# Patient Record
Sex: Female | Born: 1995 | Race: White | Hispanic: No | Marital: Single | Smoking: Never smoker
Health system: Southern US, Community
[De-identification: ages and names within clinical notes are randomized; demographics above are authoritative.]

## PROBLEM LIST (undated history)

## (undated) DIAGNOSIS — K37 Unspecified appendicitis: Secondary | ICD-10-CM

## (undated) DIAGNOSIS — D649 Anemia, unspecified: Secondary | ICD-10-CM

## (undated) HISTORY — PX: APPENDECTOMY: SHX54

---

## 2017-06-30 NOTE — L&D Delivery Note (Addendum)
Patient: Julia Wallace MRN: [id number]  GBS status: Negative  Patient is a 22 y.o. now [id number] s/p NSVD at [redacted]w[redacted]d, who was admitted for . AROM 0h 84m prior to delivery with light meconium fluid.    Delivery Note At 8:34 AM a viable female was delivered via Vaginal, Spontaneous (Presentation: Left OA).  APGAR: 9, 9; weight pending.   Placenta status: Spontaneous, intact.  Cord: intact, 3 vessel with the following complications: nuchal cord x1.   Anesthesia:   Episiotomy: None Lacerations: 2nd degree;Perineal Suture Repair: 3.0 vicryl Est. Blood Loss (mL): 366  Head delivered left OA. Nuchal cord x1 present. Shoulder and body delivered in usual fashion. Infant with spontaneous cry, placed on mother's abdomen, dried and bulb suctioned. Cord clamped x 2 after 1-minute delay, and cut by family member. Cord blood drawn. Placenta delivered spontaneously with gentle cord traction. Fundus firm with massage and Pitocin. Perineum inspected and found to have a 2nd degree laceration, which was repaired with 3.0 vicryl with good hemostasis achieved. Also found to have a two superficial labial abrasions, which were hemostatic.   Mom to postpartum.  Baby to Couplet care / Skin to Skin.   Allayne Stack 04/10/2018, 9:22 AM  Attestation: I have seen this patient and agree with the resident's documentation. I was present for the entire delivery and assisted with the repair.   Cristal Deer. Earlene Plater, DO OB/GYN Fellow

## 2017-11-12 LAB — OB RESULTS CONSOLE HEPATITIS B SURFACE ANTIGEN: HEP B S AG: NEGATIVE

## 2017-11-13 LAB — OB RESULTS CONSOLE HIV ANTIBODY (ROUTINE TESTING): HIV: NONREACTIVE

## 2017-11-13 LAB — OB RESULTS CONSOLE ABO/RH: RH Type: NEGATIVE

## 2017-11-13 LAB — OB RESULTS CONSOLE RUBELLA ANTIBODY, IGM: Rubella: IMMUNE

## 2017-11-13 LAB — OB RESULTS CONSOLE ANTIBODY SCREEN: Antibody Screen: NEGATIVE

## 2018-02-08 LAB — OB RESULTS CONSOLE RPR: RPR: NONREACTIVE

## 2018-02-08 LAB — OB RESULTS CONSOLE GC/CHLAMYDIA
Chlamydia: NEGATIVE
GC PROBE AMP, GENITAL: NEGATIVE

## 2018-03-15 LAB — OB RESULTS CONSOLE GBS: STREP GROUP B AG: NEGATIVE

## 2018-04-10 ENCOUNTER — Inpatient Hospital Stay (HOSPITAL_COMMUNITY)
Admission: AD | Admit: 2018-04-10 | Discharge: 2018-04-11 | DRG: 807 | Disposition: A | Discharge status: Home / Self Care | Payer: Medicaid Other | Attending: Family Medicine | Admitting: Family Medicine

## 2018-04-10 ENCOUNTER — Other Ambulatory Visit: Payer: Self-pay

## 2018-04-10 ENCOUNTER — Encounter (HOSPITAL_COMMUNITY): Payer: Self-pay | Admitting: *Deleted

## 2018-04-10 DIAGNOSIS — O479 False labor, unspecified: Secondary | ICD-10-CM

## 2018-04-10 DIAGNOSIS — O9902 Anemia complicating childbirth: Secondary | ICD-10-CM | POA: Diagnosis present

## 2018-04-10 DIAGNOSIS — D649 Anemia, unspecified: Secondary | ICD-10-CM | POA: Diagnosis present

## 2018-04-10 DIAGNOSIS — O99214 Obesity complicating childbirth: Secondary | ICD-10-CM | POA: Diagnosis present

## 2018-04-10 DIAGNOSIS — F17200 Nicotine dependence, unspecified, uncomplicated: Secondary | ICD-10-CM | POA: Diagnosis present

## 2018-04-10 DIAGNOSIS — Z3A39 39 weeks gestation of pregnancy: Secondary | ICD-10-CM | POA: Diagnosis not present

## 2018-04-10 DIAGNOSIS — Z3483 Encounter for supervision of other normal pregnancy, third trimester: Secondary | ICD-10-CM | POA: Diagnosis present

## 2018-04-10 DIAGNOSIS — O99334 Smoking (tobacco) complicating childbirth: Secondary | ICD-10-CM | POA: Diagnosis present

## 2018-04-10 DIAGNOSIS — E669 Obesity, unspecified: Secondary | ICD-10-CM | POA: Diagnosis present

## 2018-04-10 HISTORY — DX: Anemia, unspecified: D64.9

## 2018-04-10 LAB — HIV ANTIBODY (ROUTINE TESTING W REFLEX): HIV Screen 4th Generation wRfx: NONREACTIVE

## 2018-04-10 LAB — CBC
HEMATOCRIT: 33.6 % — AB (ref 36.0–46.0)
HEMOGLOBIN: 10.8 g/dL — AB (ref 12.0–15.0)
MCH: 26 pg (ref 26.0–34.0)
MCHC: 32.1 g/dL (ref 30.0–36.0)
MCV: 80.8 fL (ref 80.0–100.0)
Platelets: 182 10*3/uL (ref 150–400)
RBC: 4.16 MIL/uL (ref 3.87–5.11)
RDW: 15.4 % (ref 11.5–15.5)
WBC: 15.7 10*3/uL — ABNORMAL HIGH (ref 4.0–10.5)
nRBC: 0 % (ref 0.0–0.2)

## 2018-04-10 MED ORDER — OXYCODONE-ACETAMINOPHEN 5-325 MG PO TABS: 1.0000 | ORAL_TABLET | ORAL | Status: DC | PRN

## 2018-04-10 MED ORDER — ONDANSETRON HCL 4 MG PO TABS: 4.0000 mg | ORAL_TABLET | ORAL | Status: DC | PRN

## 2018-04-10 MED ORDER — SODIUM CHLORIDE 0.9 % IV SOLN: 250.0000 mL | INTRAVENOUS | Status: DC | PRN

## 2018-04-10 MED ORDER — LIDOCAINE HCL (PF) 1 % IJ SOLN
30.0000 mL | INTRAMUSCULAR | Status: DC | PRN
  Administered 2018-04-10: 30 mL via SUBCUTANEOUS
  Filled 2018-04-10: qty 30

## 2018-04-10 MED ORDER — COCONUT OIL OIL: 1.0000 "application " | TOPICAL_OIL | Status: DC | PRN

## 2018-04-10 MED ORDER — SIMETHICONE 80 MG PO CHEW: 80.0000 mg | CHEWABLE_TABLET | ORAL | Status: DC | PRN

## 2018-04-10 MED ORDER — PRENATAL MULTIVITAMIN CH
1.0000 | ORAL_TABLET | Freq: Every day | ORAL | Status: DC
  Administered 2018-04-10 – 2018-04-11 (×2): 1 via ORAL
  Filled 2018-04-10 (×2): qty 1

## 2018-04-10 MED ORDER — MEASLES, MUMPS & RUBELLA VAC ~~LOC~~ INJ
0.5000 mL | INJECTION | Freq: Once | SUBCUTANEOUS | Status: DC
  Filled 2018-04-10: qty 0.5

## 2018-04-10 MED ORDER — SODIUM CHLORIDE 0.9% FLUSH: 3.0000 mL | Freq: Two times a day (BID) | INTRAVENOUS | Status: DC

## 2018-04-10 MED ORDER — IBUPROFEN 600 MG PO TABS
600.0000 mg | ORAL_TABLET | Freq: Four times a day (QID) | ORAL | Status: DC
  Administered 2018-04-10 – 2018-04-11 (×5): 600 mg via ORAL
  Filled 2018-04-10 (×5): qty 1

## 2018-04-10 MED ORDER — LACTATED RINGERS IV SOLN: 500.0000 mL | INTRAVENOUS | Status: DC | PRN

## 2018-04-10 MED ORDER — OXYTOCIN BOLUS FROM INFUSION
500.0000 mL | Freq: Once | INTRAVENOUS | Status: AC
  Administered 2018-04-10: 500 mL via INTRAVENOUS

## 2018-04-10 MED ORDER — ONDANSETRON HCL 4 MG/2ML IJ SOLN: 4.0000 mg | INTRAMUSCULAR | Status: DC | PRN

## 2018-04-10 MED ORDER — LACTATED RINGERS IV SOLN
INTRAVENOUS | Status: DC
  Administered 2018-04-10: 02:00:00 via INTRAVENOUS

## 2018-04-10 MED ORDER — SOD CITRATE-CITRIC ACID 500-334 MG/5ML PO SOLN: 30.0000 mL | ORAL | Status: DC | PRN

## 2018-04-10 MED ORDER — DIBUCAINE 1 % RE OINT: 1.0000 "application " | TOPICAL_OINTMENT | RECTAL | Status: DC | PRN

## 2018-04-10 MED ORDER — TETANUS-DIPHTH-ACELL PERTUSSIS 5-2.5-18.5 LF-MCG/0.5 IM SUSP: 0.5000 mL | Freq: Once | INTRAMUSCULAR | Status: DC

## 2018-04-10 MED ORDER — SENNOSIDES-DOCUSATE SODIUM 8.6-50 MG PO TABS
2.0000 | ORAL_TABLET | ORAL | Status: DC
  Administered 2018-04-10: 2 via ORAL
  Filled 2018-04-10: qty 2

## 2018-04-10 MED ORDER — OXYCODONE-ACETAMINOPHEN 5-325 MG PO TABS: 2.0000 | ORAL_TABLET | ORAL | Status: DC | PRN

## 2018-04-10 MED ORDER — OXYTOCIN 40 UNITS IN LACTATED RINGERS INFUSION - SIMPLE MED
2.5000 [IU]/h | INTRAVENOUS | Status: DC
  Administered 2018-04-10: 2.5 [IU]/h via INTRAVENOUS
  Filled 2018-04-10: qty 1000

## 2018-04-10 MED ORDER — SODIUM CHLORIDE 0.9% FLUSH: 3.0000 mL | INTRAVENOUS | Status: DC | PRN

## 2018-04-10 MED ORDER — BENZOCAINE-MENTHOL 20-0.5 % EX AERO
1.0000 "application " | INHALATION_SPRAY | CUTANEOUS | Status: DC | PRN
  Administered 2018-04-10: 1 via TOPICAL
  Filled 2018-04-10: qty 56

## 2018-04-10 MED ORDER — WITCH HAZEL-GLYCERIN EX PADS: 1.0000 "application " | MEDICATED_PAD | CUTANEOUS | Status: DC | PRN

## 2018-04-10 MED ORDER — ZOLPIDEM TARTRATE 5 MG PO TABS: 5.0000 mg | ORAL_TABLET | Freq: Every evening | ORAL | Status: DC | PRN

## 2018-04-10 MED ORDER — ACETAMINOPHEN 325 MG PO TABS: 650.0000 mg | ORAL_TABLET | ORAL | Status: DC | PRN

## 2018-04-10 MED ORDER — ONDANSETRON HCL 4 MG/2ML IJ SOLN
4.0000 mg | Freq: Four times a day (QID) | INTRAMUSCULAR | Status: DC | PRN
  Administered 2018-04-10: 4 mg via INTRAVENOUS
  Filled 2018-04-10: qty 2

## 2018-04-10 MED ORDER — DIPHENHYDRAMINE HCL 25 MG PO CAPS: 25.0000 mg | ORAL_CAPSULE | Freq: Four times a day (QID) | ORAL | Status: DC | PRN

## 2018-04-10 NOTE — Progress Notes (Signed)
Patient ID: Julia Wallace, female   DOB: Jul 06, 1995, 22 y.o.   MRN: [id number] Doing well Vitals:   04/10/18 0304 04/10/18 0347 04/10/18 0450 04/10/18 0510  BP: (!) 110/94 118/66 118/60 127/62  Pulse: 88 83 76 99  Resp: 20  18   Temp:   97.7 F (36.5 C)   TempSrc:   Oral   Weight:      Height:       FHR stable  Dilation: 7 Effacement (%): 70 Cervical Position: Middle Station: -1 Presentation: Vertex Exam by:: Renaldo Harrison, RNC  Anticipate SVD

## 2018-04-10 NOTE — H&P (Addendum)
Julia Wallace is a 22 y.o. female G2P1001 with IUP at [redacted]w[redacted]d presenting for contractions which started at 6am and got closer as the day went on.. Pt states she has been having regular, every 5 minutes contractions, associated with none vaginal bleeding..  Membranes are intact, with active fetal movement.   [facility]   Prenatal History/Complications:  Past Medical History: Past Medical History:  Diagnosis Date  . Anemia     Past Surgical History: Past Surgical History:  Procedure Laterality Date  . APPENDECTOMY      Obstetrical History: OB History    Gravida  2   Para  1   Term  1   Preterm      AB      Living  1     SAB      TAB      Ectopic      Multiple      Live Births  1            Social History: Social History   Socioeconomic History  . Marital status: Single    Spouse name: Not on file  . Number of children: Not on file  . Years of education: Not on file  . Highest education level: Not on file  Occupational History  . Not on file  Social Needs  . Financial resource strain: Not very hard  . Food insecurity:    Worry: Patient refused    Inability: Patient refused  . Transportation needs:    Medical: Patient refused    Non-medical: Patient refused  Tobacco Use  . Smoking status: Never Smoker  . Smokeless tobacco: Never Used  Substance and Sexual Activity  . Alcohol use: Never    Frequency: Never  . Drug use: Never  . Sexual activity: Yes  Lifestyle  . Physical activity:    Days per week: 7 days    Minutes per session: 60 min  . Stress: Patient refused  Relationships  . Social connections:    Talks on phone: Patient refused    Gets together: Patient refused    Attends religious service: Patient refused    Active member of club or organization: Patient refused    Attends meetings of clubs or organizations: Patient refused    Relationship status: Patient refused  Other Topics Concern  . Not on file  Social  History Narrative  . Not on file    Family History: Family History  Problem Relation Age of Onset  . Heart disease Maternal Grandmother     Allergies: Not on File  No medications prior to admission.    Review of Systems   Constitutional: Negative for fever and chills Eyes: Negative for visual disturbances Respiratory: Negative for shortness of breath, dyspnea Cardiovascular: Negative for chest pain or palpitations  Gastrointestinal: Negative for vomiting, diarrhea and constipation.  POSITIVE for abdominal pain (contractions) Genitourinary: Negative for dysuria and urgency Musculoskeletal: Negative for back pain, joint pain, myalgias  Neurological: Negative for dizziness and headaches  Blood pressure 107/70, pulse 83, temperature 97.8 F (36.6 C), temperature source Oral, resp. rate 18, height 5\' 2"  (1.575 m), weight 88.5 kg. General appearance: alert, cooperative and no distress Lungs: clear to auscultation bilaterally Heart: regular rate and rhythm Abdomen: soft, non-tender; bowel sounds normal Extremities: Homans sign is negative, no sign of DVT DTR's 2+ Presentation: cephalic Fetal monitoring  Baseline: 140 bpm, Variability: Good {> 6 bpm), Accelerations: Reactive and Decelerations: Absent Uterine activity  Frequency: Every  3 minutes Dilation: 6 Effacement (%): 70 Station: -1, 0 Exam by:: K.Wilson,RN   Prenatal labs: ABO, Rh: --/--/O NEG (10/12 0136) Antibody: PENDING (10/12 0136) Rubella: Immune (05/17 0000) RPR: Nonreactive (08/12 0000)  HBsAg: Negative (05/16 0000)  HIV: Non-reactive (05/17 0000)  GBS: Negative (09/16 0000)  1 hr Glucola normal Genetic screening  normal Anatomy US normal  Prenatal Transfer Tool  Maternal Diabetes: No Genetic Screening: Normal Maternal Ultrasounds/Referrals: Normal Fetal Ultrasounds or other Referrals:  None Maternal Substance Abuse:  Yes:  Type: Smoker Significant Maternal Medications:  None Significant Maternal  Lab Results: Lab values include: Group B Strep negative     Results for orders placed or performed during the hospital encounter of 04/10/18 (from the past 24 hour(s))  CBC   Collection Time: 04/10/18  1:36 AM  Result Value Ref Range   WBC 15.7 (H) 4.0 - 10.5 K/uL   RBC 4.16 3.87 - 5.11 MIL/uL   Hemoglobin 10.8 (L) 12.0 - 15.0 g/dL   HCT 09.8 (L) 11.9 - 14.7 %   MCV 80.8 80.0 - 100.0 fL   MCH 26.0 26.0 - 34.0 pg   MCHC 32.1 30.0 - 36.0 g/dL   RDW 82.9 56.2 - 13.0 %   Platelets 182 150 - 400 K/uL   nRBC 0.0 0.0 - 0.2 %  Type and screen [facility]   Collection Time: 04/10/18  1:36 AM  Result Value Ref Range   ABO/RH(D) O NEG    Antibody Screen PENDING    Sample Expiration      04/13/2018 Performed at [facility], [address] [place], [place] [address]     Assessment: Julia Wallace is a 22 y.o. G2P1001 with an IUP at [redacted]w[redacted]d presenting for Active labor  Plan: #Labor: expectant management #Pain:  Per request #FWB Cat 1 #ID: GBS: Negative  #MOF:  Breast #MOC: Pills #Circ: n/a   Wynelle Bourgeois 04/10/2018, 2:43 AM

## 2018-04-10 NOTE — MAU Note (Signed)
Ctx since 6am now about 5 min apart. Denies any vag bleeding or leaking. Good fetal movement reported.

## 2018-04-10 NOTE — Progress Notes (Signed)
Patient ID: Julia Wallace, female   DOB: October 13, 1995, 22 y.o.   MRN: [id number] More pain with UCs  AROM clear to light MSAF  Vitals:   04/10/18 0645 04/10/18 0646 04/10/18 0732 04/10/18 0756  BP: (!) 124/38 119/65 122/66 121/64  Pulse: 92 70 83 75  Resp:  20 16   Temp:   97.7 F (36.5 C)   TempSrc:   Oral   Weight:      Height:       FHR reactive UCs q 3 min  Dilation: 8 Effacement (%): 90 Cervical Position: Middle Station: -1 Presentation: Vertex Exam by:: Artelia Laroche CNM  Anticipate SVD soon

## 2018-04-10 NOTE — Anesthesia Pain Management Evaluation Note (Signed)
  CRNA Pain Management Visit Note  Patient: Julia Wallace, 22 y.o., female  "Hello I am a member of the anesthesia team at [facility]. We have an anesthesia team available at all times to provide care throughout the hospital, including epidural management and anesthesia for C-section. I don't know your plan for the delivery whether it a natural birth, water birth, IV sedation, nitrous supplementation, doula or epidural, but we want to meet your pain goals."   1.Was your pain managed to your expectations on prior hospitalizations?   No prior hospitalizations  2.What is your expectation for pain management during this hospitalization?     Labor support without medications  3.How can we help you reach that goal? Natural   Record the patient's initial score and the patient's pain goal.   Pain: 9  Pain Goal: 10 [facility] wants you to be able to say your pain was always managed very well.  Rica Records 04/10/2018

## 2018-04-11 LAB — RPR: RPR: NONREACTIVE

## 2018-04-11 MED ORDER — DROSPIRENONE-ETHINYL ESTRADIOL 3-0.03 MG PO TABS: 1.0000 | ORAL_TABLET | Freq: Every day | ORAL | 4 refills | Status: AC

## 2018-04-11 MED ORDER — RHO D IMMUNE GLOBULIN 1500 UNIT/2ML IJ SOSY
300.0000 ug | PREFILLED_SYRINGE | Freq: Once | INTRAMUSCULAR | Status: AC
  Administered 2018-04-11: 300 ug via INTRAMUSCULAR
  Filled 2018-04-11: qty 2

## 2018-04-11 MED ORDER — IBUPROFEN 600 MG PO TABS: 600.0000 mg | ORAL_TABLET | Freq: Four times a day (QID) | ORAL | 0 refills | Status: AC

## 2018-04-11 NOTE — Lactation Note (Signed)
This note was copied from a baby's chart. Lactation Consultation Note  Patient Name: Julia Wallace ZOXWR'U Date: 04/11/2018 Reason for consult: Initial assessment;Term P2, 20 hr , term female infant. Per mom, infant had 3 wet and 6 soiled diapers.  Per mom, she BF her 42 month old elder daughter for 2 weeks but stopped due to issues with latching infant to breast. Mom feels BF is going well with 2nd child. Per mom, BF 20 minutes prior LC entering room infant active feeding at breast with deep latch and swallows observed. Latch 10 no assistance need by LC. LC discussed I & O. Mom encouraged to feed baby 8-12 times/24 hours and with feeding cues.  Reviewed Baby & Me book's Breastfeeding Basics.  Mom made aware of O/P services, breastfeeding support groups, community resources, and our phone # for post-discharge questions.   Maternal Data Formula Feeding for Exclusion: No Has patient been taught Hand Expression?: Yes(Mom hand expressed and colostrum present.) Does the patient have breastfeeding experience prior to this delivery?: Yes  Feeding Feeding Type: Breast Fed  LATCH Score Latch: Grasps breast easily, tongue down, lips flanged, rhythmical sucking.  Audible Swallowing: Spontaneous and intermittent  Type of Nipple: Everted at rest and after stimulation  Comfort (Breast/Nipple): Soft / non-tender  Hold (Positioning): No assistance needed to correctly position infant at breast.  LATCH Score: 10  Interventions Interventions: Breast feeding basics reviewed;Hand express;Breast compression  Lactation Tools Discussed/Used WIC Program: No   Consult Status Consult Status: Follow-up Date: 04/12/18 Follow-up type: In-patient    Danelle Earthly 04/11/2018, 5:30 AM

## 2018-04-11 NOTE — Discharge Summary (Signed)
Obstetrics Discharge Summary OB/GYN Faculty Practice   Patient Name: Julia Wallace DOB: Aug 29, 1995 MRN: [id number]  Date of admission: 04/10/2018 Delivering MD: Allayne Stack   Date of discharge: 04/11/2018  Admitting diagnosis: 39wks ctxs 5 mins apart Intrauterine pregnancy: [redacted]w[redacted]d     Secondary diagnosis:   Active Problems:   Smoker   Anemia   Obesity   Uterine contractions during pregnancy    Discharge diagnosis: Term Pregnancy Delivered                                    Postpartum procedures: None  Complications: none  Hospital course: Julia Wallace is a 22 y.o. [redacted]w[redacted]d who was admitted for spontaneous onset of labor. Her pregnancy was complicated by tobacco use, obesity. Her labor course was notable for AROM. Delivery was complicated by nuchal cord x 1, 2nd degree perineal laceartion. Please see delivery/op note for additional details. Her postpartum course was uncomplicated. She was breastfeeding without difficulty. By day of discharge, she was passing flatus, urinating, eating and drinking without difficulty. Her pain was well-controlled, and she was discharged home with ibuprofen. She will follow-up in clinic in 4-6 weeks.   Physical exam  Vitals:   04/10/18 1530 04/10/18 2053 04/10/18 2359 04/11/18 0518  BP: 110/65 114/75 114/72 107/61  Pulse: 74 76 71 71  Resp: 16 18 18 18   Temp: (!) 97.5 F (36.4 C) 97.8 F (36.6 C) 97.9 F (36.6 C) 97.9 F (36.6 C)  TempSrc: Oral Oral Oral Oral  SpO2:      Weight:      Height:       General: well-appearing, NAD Lochia: appropriate Uterine Fundus: firm Incision: N/A DVT Evaluation: No significant calf/ankle edema. Labs: Lab Results  Component Value Date   WBC 15.7 (H) 04/10/2018   HGB 10.8 (L) 04/10/2018   HCT 33.6 (L) 04/10/2018   MCV 80.8 04/10/2018   PLT 182 04/10/2018   No flowsheet data found.  Discharge instructions: Per After Visit Summary and "Baby and Me Booklet"  After visit meds:  Allergies  as of 04/11/2018   No Known Allergies     Medication List    TAKE these medications   drospirenone-ethinyl estradiol 3-0.03 MG tablet Commonly known as:  YASMIN,ZARAH,SYEDA Take 1 tablet by mouth daily. Start taking on:  05/26/2018   ibuprofen 600 MG tablet Commonly known as:  ADVIL,MOTRIN Take 1 tablet (600 mg total) by mouth every 6 (six) hours.   prenatal multivitamin Tabs tablet Take 1 tablet by mouth daily at 12 noon.       Postpartum contraception: Combination OCPs - counseled on starting in 6 weeks  Diet: Routine Diet Activity: Advance as tolerated. Pelvic rest for 6 weeks.   Outpatient follow up:4 weeks - instructed to call to arrange follow-up   Newborn Data: Live born female  Birth Weight: 8 lb (3630 g) APGAR: 9, 9  Newborn Delivery   Birth date/time:  04/10/2018 08:34:00 Delivery type:  Vaginal, Spontaneous    Baby Feeding: Breast Disposition:home with mother  Cristal Deer. Earlene Plater, DO OB/GYN Fellow, Faculty Practice

## 2018-04-11 NOTE — Lactation Note (Signed)
This note was copied from a baby's chart. Lactation Consultation Note  Patient Name: Julia Wallace ZOXWR'U Date: 04/11/2018 Reason for consult: Follow-up assessment;Other (Comment);Term;Infant weight loss(P2 - exp BF , mom requesting early D/C / 4 % weight loss )  Baby is 24 hours old.  As LC entered the room, mom resting in bed holding sleeping baby.  Per mom the baby recently fed at 9:35 for 20 mins, reports comfort and swallows.  Mom denies soreness. Sore nipple and engorgement prevention and tx reviewed.  LC provided a hand pump with instructions. And #27 F if needed when milk comes in.  [facility] stressed the importance of STS feedings until the baby is back to birth weight ,steadily gaining, and can stay awake for a feeding.  Discussed nutritive vs non - nutritive feeding patterns and the importance of watching  For hanging out latched.  LC mentioned if the baby feeds on the 1st breast and doesn't fed 2nd breast don't allow the  The 2nd breast to keep filling, release down to comfort.  Mother informed of post-discharge support and given phone number to the lactation department, including services for phone call assistance; out-patient appointments; and breastfeeding support group. List of other breastfeeding resources in the community given in the handout. Encouraged mother to call for problems or concerns related to breastfeeding.   Maternal Data Has patient been taught Hand Expression?: (per mom feels comfortable hand expressing )  Feeding Feeding Type: (per mom baby last fed at 0935 )  LATCH Score                   Interventions Interventions: Breast feeding basics reviewed;Hand pump  Lactation Tools Discussed/Used Tools: Pump;Flanges Flange Size: 24;27 Breast pump type: Manual Pump Review: Setup, frequency, and cleaning;Milk Storage(per mom used #24 F with 1st baby / #27 given for when milk comes in ) Initiated by:: MAI  Date initiated:: 04/11/18   Consult  Status Consult Status: Complete Date: 04/11/18    Julia Wallace 04/11/2018, 10:36 AM

## 2018-04-12 LAB — RH IG WORKUP (INCLUDES ABO/RH)
ABO/RH(D): O NEG
FETAL SCREEN: NEGATIVE
Gestational Age(Wks): 39.6
UNIT DIVISION: 0

## 2018-04-14 LAB — TYPE AND SCREEN
ABO/RH(D): O NEG
ANTIBODY SCREEN: POSITIVE
UNIT DIVISION: 0
Unit division: 0

## 2018-04-14 LAB — BPAM RBC
BLOOD PRODUCT EXPIRATION DATE: 201911042359
BLOOD PRODUCT EXPIRATION DATE: 201911042359
Unit Type and Rh: 9500
Unit Type and Rh: 9500

## 2020-03-23 ENCOUNTER — Emergency Department (HOSPITAL_COMMUNITY)
Admission: EM | Admit: 2020-03-23 | Discharge: 2020-03-23 | Disposition: A | Discharge status: Home / Self Care | Payer: Medicaid Other | Attending: Emergency Medicine | Admitting: Emergency Medicine

## 2020-03-23 ENCOUNTER — Emergency Department (HOSPITAL_COMMUNITY): Payer: Medicaid Other

## 2020-03-23 ENCOUNTER — Encounter (HOSPITAL_COMMUNITY): Payer: Self-pay | Admitting: Emergency Medicine

## 2020-03-23 ENCOUNTER — Other Ambulatory Visit: Payer: Self-pay

## 2020-03-23 DIAGNOSIS — R110 Nausea: Secondary | ICD-10-CM | POA: Insufficient documentation

## 2020-03-23 DIAGNOSIS — R509 Fever, unspecified: Secondary | ICD-10-CM | POA: Diagnosis not present

## 2020-03-23 DIAGNOSIS — R05 Cough: Secondary | ICD-10-CM | POA: Insufficient documentation

## 2020-03-23 DIAGNOSIS — R0602 Shortness of breath: Secondary | ICD-10-CM | POA: Insufficient documentation

## 2020-03-23 DIAGNOSIS — J1282 Pneumonia due to coronavirus disease 2019: Secondary | ICD-10-CM

## 2020-03-23 DIAGNOSIS — U071 COVID-19: Secondary | ICD-10-CM

## 2020-03-23 HISTORY — DX: Unspecified appendicitis: K37

## 2020-03-23 LAB — COMPREHENSIVE METABOLIC PANEL
ALT: 24 U/L (ref 0–44)
AST: 26 U/L (ref 15–41)
Albumin: 3.8 g/dL (ref 3.5–5.0)
Alkaline Phosphatase: 74 U/L (ref 38–126)
Anion gap: 10 (ref 5–15)
BUN: 11 mg/dL (ref 6–20)
CO2: 25 mmol/L (ref 22–32)
Calcium: 8.4 mg/dL — ABNORMAL LOW (ref 8.9–10.3)
Chloride: 105 mmol/L (ref 98–111)
Creatinine, Ser: 0.8 mg/dL (ref 0.44–1.00)
GFR calc Af Amer: >60 mL/min (ref >60)
GFR calc non Af Amer: >60 mL/min (ref >60)
Glucose, Bld: 108 mg/dL — ABNORMAL HIGH (ref 70–99)
Potassium: 3.6 mmol/L (ref 3.5–5.1)
Sodium: 140 mmol/L (ref 135–145)
Total Bilirubin: 0.1 mg/dL — ABNORMAL LOW (ref 0.3–1.2)
Total Protein: 7.5 g/dL (ref 6.5–8.1)

## 2020-03-23 LAB — CBC WITH DIFFERENTIAL/PLATELET
Abs Immature Granulocytes: 0.03 10*3/uL (ref 0.00–0.07)
Basophils Absolute: 0 10*3/uL (ref 0.0–0.1)
Basophils Relative: 0 %
Eosinophils Absolute: 0 10*3/uL (ref 0.0–0.5)
Eosinophils Relative: 1 %
HCT: 43.6 % (ref 36.0–46.0)
Hemoglobin: 14.4 g/dL (ref 12.0–15.0)
Immature Granulocytes: 1 %
Lymphocytes Relative: 26 %
Lymphs Abs: 1.4 10*3/uL (ref 0.7–4.0)
MCH: 28.9 pg (ref 26.0–34.0)
MCHC: 33 g/dL (ref 30.0–36.0)
MCV: 87.6 fL (ref 80.0–100.0)
Monocytes Absolute: 0.6 10*3/uL (ref 0.1–1.0)
Monocytes Relative: 11 %
Neutro Abs: 3.2 10*3/uL (ref 1.7–7.7)
Neutrophils Relative %: 61 %
Platelets: 210 10*3/uL (ref 150–400)
RBC: 4.98 MIL/uL (ref 3.87–5.11)
RDW: 13.8 % (ref 11.5–15.5)
WBC: 5.2 10*3/uL (ref 4.0–10.5)
nRBC: 0 % (ref 0.0–0.2)

## 2020-03-23 LAB — I-STAT BETA HCG BLOOD, ED (MC, WL, AP ONLY): I-stat hCG, quantitative: <5 m[IU]/mL (ref <5)

## 2020-03-23 MED ORDER — METHYLPREDNISOLONE SODIUM SUCC 125 MG IJ SOLR: 125.0000 mg | Freq: Once | INTRAMUSCULAR | Status: DC | PRN

## 2020-03-23 MED ORDER — SODIUM CHLORIDE 0.9 % IV SOLN: INTRAVENOUS | Status: DC | PRN

## 2020-03-23 MED ORDER — SODIUM CHLORIDE 0.9 % IV BOLUS
1000.0000 mL | Freq: Once | INTRAVENOUS | Status: AC
  Administered 2020-03-23: 1000 mL via INTRAVENOUS

## 2020-03-23 MED ORDER — FAMOTIDINE IN NACL 20-0.9 MG/50ML-% IV SOLN: 20.0000 mg | Freq: Once | INTRAVENOUS | Status: DC | PRN

## 2020-03-23 MED ORDER — ALBUTEROL SULFATE HFA 108 (90 BASE) MCG/ACT IN AERS: 2.0000 | INHALATION_SPRAY | Freq: Once | RESPIRATORY_TRACT | Status: DC | PRN

## 2020-03-23 MED ORDER — EPINEPHRINE 0.3 MG/0.3ML IJ SOAJ: 0.3000 mg | Freq: Once | INTRAMUSCULAR | Status: DC | PRN

## 2020-03-23 MED ORDER — DIPHENHYDRAMINE HCL 50 MG/ML IJ SOLN: 50.0000 mg | Freq: Once | INTRAMUSCULAR | Status: DC | PRN

## 2020-03-23 MED ORDER — SODIUM CHLORIDE 0.9 % IV SOLN: 1200.0000 mg | Freq: Once | INTRAVENOUS | Status: DC

## 2020-03-23 MED ORDER — AZITHROMYCIN 250 MG PO TABS: ORAL_TABLET | ORAL | 0 refills | Status: AC

## 2020-03-23 MED ORDER — SODIUM CHLORIDE 0.9 % IV SOLN
Freq: Once | INTRAVENOUS | Status: AC
  Filled 2020-03-23: qty 20

## 2020-03-23 NOTE — ED Triage Notes (Signed)
Pt was dx COVID+ on 9/15 and states she has been having shob x 3 days. Also endorses productive cough and nausea. Denies chest pain, fever, emesis, and diarrhea. Pt states she has been medicating with vicks sinus severe and dayquil. Pt able to tolerate PO intake. Vitals stable and pt ambulatory to room with no issues.

## 2020-03-23 NOTE — Discharge Instructions (Addendum)
Take motrin, tylenol for fever   You received antibody infusion today   Take zpack as prescribed   See your doctor   Return to ER if you have trouble breathing, shortness of breath, fever, dehydration

## 2020-03-23 NOTE — ED Notes (Signed)
X-ray at bedside

## 2020-03-23 NOTE — ED Provider Notes (Signed)
[facility]-EMERGENCY DEPT Provider Note   CSN: [id number] Arrival date & time: 03/23/20  1621     History Chief Complaint  Patient presents with  . Shortness of Breath  . Nausea  . Cough     Julia Wallace is a 24 y.o. female hx of anemia, appendicitis, here with shortness of breath, nausea. Patient was diagnosed with COVID September 15th. She did not receive the Covid vaccine beforehand. Patient has been having cough and shortness of breath. Patient states that her symptoms got worse and she is here for evaluation. Patient called her doctor to schedule for a monoclonal antibody infusion but did not receive a call back to schedule it. Patient has some low-grade temperature at home. Patient denies any history of lung disease  The history is provided by the patient.       Past Medical History:  Diagnosis Date  . Anemia   . Appendicitis     Patient Active Problem List   Diagnosis Date Noted  . Smoker 04/10/2018  . Anemia 04/10/2018  . Obesity 04/10/2018  . Uterine contractions during pregnancy 04/10/2018    Past Surgical History:  Procedure Laterality Date  . APPENDECTOMY       OB History    Gravida  2   Para  2   Term  2   Preterm      AB      Living  2     SAB      TAB      Ectopic      Multiple  0   Live Births  2           Family History  Problem Relation Age of Onset  . Heart disease Maternal Grandmother     Social History   Tobacco Use  . Smoking status: Never Smoker  . Smokeless tobacco: Never Used  Vaping Use  . Vaping Use: Never used  Substance Use Topics  . Alcohol use: Never  . Drug use: Never    Home Medications Prior to Admission medications   Medication Sig Start Date End Date Taking? Authorizing Provider  drospirenone-ethinyl estradiol (YASMIN,ZARAH,SYEDA) 3-0.03 MG tablet Take 1 tablet by mouth daily. 05/26/18   Tamera Stands, DO  ibuprofen (ADVIL,MOTRIN) 600 MG tablet Take 1 tablet  (600 mg total) by mouth every 6 (six) hours. 04/11/18   Tamera Stands, DO  Prenatal Vit-Fe Fumarate-FA (PRENATAL MULTIVITAMIN) TABS tablet Take 1 tablet by mouth daily at 12 noon.    [provider]    Allergies    Patient has no known allergies.  Review of Systems   Review of Systems  Respiratory: Positive for cough and shortness of breath.   All other systems reviewed and are negative.   Physical Exam Updated Vital Signs BP 121/88   Pulse (!) 104   Temp 98.3 F (36.8 C) (Oral)   Resp (!) 29   Ht 5\' 2"  (1.575 m)   Wt 81.6 kg   SpO2 96%   BMI 32.92 kg/m   Physical Exam Vitals and nursing note reviewed.  HENT:     Head: Normocephalic.     Mouth/Throat:     Mouth: Mucous membranes are moist.  Eyes:     Extraocular Movements: Extraocular movements intact.     Pupils: Pupils are equal, round, and reactive to light.  Cardiovascular:     Rate and Rhythm: Normal rate and regular rhythm.  Pulmonary:     Comments: Slightly  tachypneic, dry crackles  Abdominal:     General: Bowel sounds are normal.     Palpations: Abdomen is soft.  Musculoskeletal:        General: Normal range of motion.     Cervical back: Normal range of motion and neck supple.  Skin:    General: Skin is warm.     Capillary Refill: Capillary refill takes less than 2 seconds.  Neurological:     General: No focal deficit present.     Mental Status: She is alert and oriented to person, place, and time.  Psychiatric:        Mood and Affect: Mood normal.        Behavior: Behavior normal.     ED Results / Procedures / Treatments   Labs (all labs ordered are listed, but only abnormal results are displayed) Labs Reviewed  CBC WITH DIFFERENTIAL/PLATELET  COMPREHENSIVE METABOLIC PANEL  I-STAT BETA HCG BLOOD, ED ([facility], [facility], [facility] ONLY)    EKG None  Radiology No results found.  Procedures Procedures (including critical care time)  Medications Ordered in ED Medications  0.9 %  sodium  chloride infusion (has no administration in time range)  diphenhydrAMINE (BENADRYL) injection 50 mg (has no administration in time range)  famotidine (PEPCID) IVPB 20 mg premix (has no administration in time range)  methylPREDNISolone sodium succinate (SOLU-MEDROL) 125 mg/2 mL injection 125 mg (has no administration in time range)  albuterol (VENTOLIN HFA) 108 (90 Base) MCG/ACT inhaler 2 puff (has no administration in time range)  EPINEPHrine (EPI-PEN) injection 0.3 mg (has no administration in time range)  sodium chloride 0.9 % bolus 1,000 mL (has no administration in time range)  bamlanivimab 700 mg, etesevimab 1,400 mg in sodium chloride 0.9 % 160 mL IVPB (has no administration in time range)    ED Course  I have reviewed the triage vital signs and the nursing notes.  Pertinent labs & imaging results that were available during my care of the patient were reviewed by me and considered in my medical decision making (see chart for details).    MDM Rules/Calculators/A&P                         Julia Wallace is a 24 y.o. female here with SOB. Patient was diagnosed with COVID on 9/15. Patient not hypoxic. Patient well appearing. Will get labs, CXR. Will give MAB infusion given that she is overweight.   6:36 PM CXR showed left basilar consolidation. WBC is normal. Afebrile, not hypoxic. Given MAB infusion. Will dc home with zpack   Final Clinical Impression(s) / ED Diagnoses Final diagnoses:  None    Rx / DC Orders ED Discharge Orders    None       Charlynne Pander, MD 03/23/20 [place] Fruit

## 2020-03-23 NOTE — ED Notes (Signed)
MD cleared pt to leave without observation after infusion.

## 2021-09-09 IMAGING — DX DG CHEST 1V PORT
1 series · 1 of 1 positions shown · non-contrast
Comparison: None.

CLINICAL DATA: TXDWV-0P diagnosed 03/14/2020, productive cough,
nausea

EXAM:
PORTABLE CHEST 1 VIEW

[chest ap]
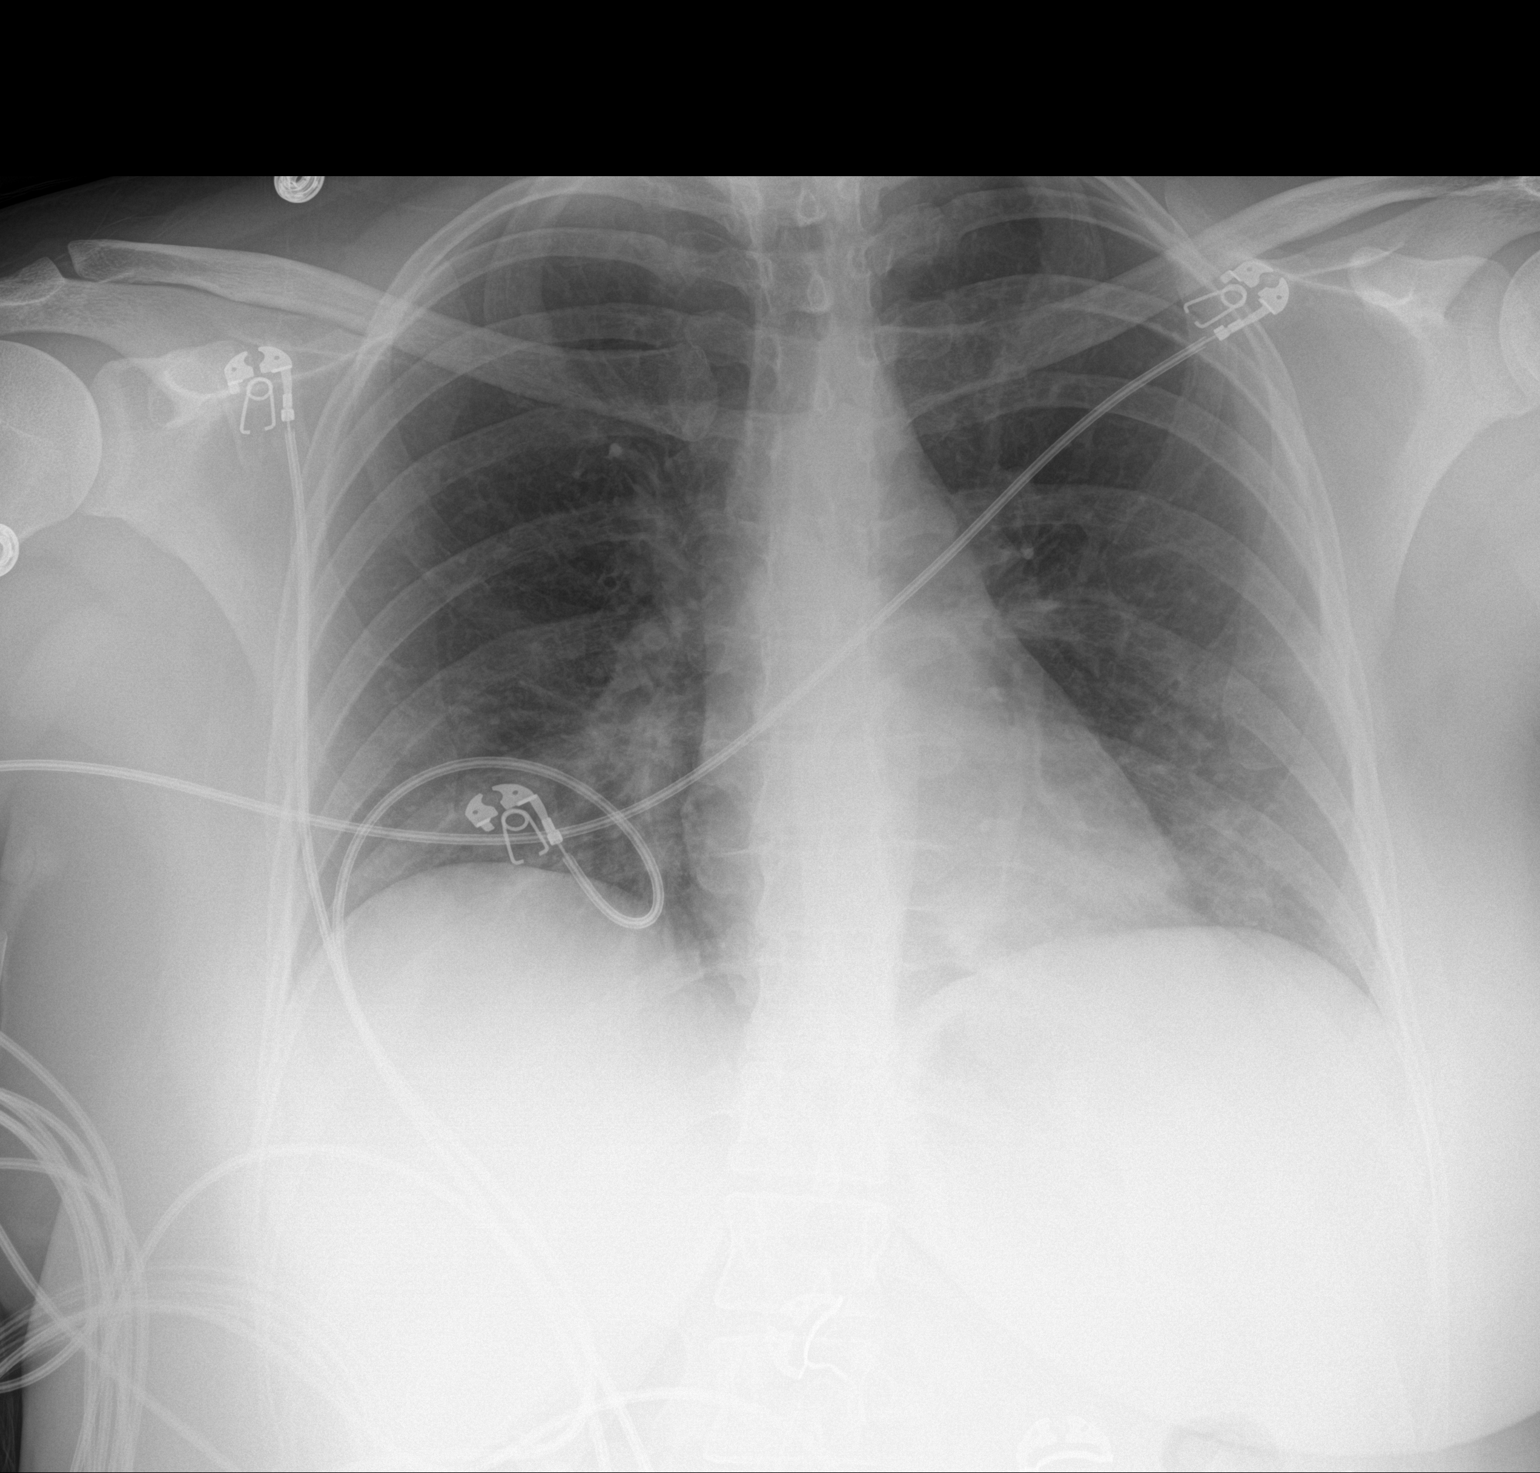

[1 of 1 positions shown; findings below may reference images not displayed]

FINDINGS: Single frontal view of the chest demonstrates patchy consolidation
at the left lung base. No effusion or pneumothorax. No acute bony
abnormalities. The cardiac silhouette is unremarkable.
IMPRESSION: 1. Patchy left basilar consolidation compatible with
bronchopneumonia.
